# Patient Record
Sex: Female | Born: 1935 | Race: White | Hispanic: No
Health system: Southern US, Community
[De-identification: ages and names within clinical notes are randomized; demographics above are authoritative.]

---

## 2015-04-04 ENCOUNTER — Inpatient Hospital Stay
Admission: AD | Admit: 2015-04-04 | Discharge: 2015-04-15 | Disposition: A | Discharge status: Skilled Nursing Facility | Payer: Medicare Other | Source: Ambulatory Visit | Attending: Internal Medicine | Admitting: Internal Medicine

## 2015-04-04 ENCOUNTER — Other Ambulatory Visit (HOSPITAL_COMMUNITY): Payer: Self-pay

## 2015-04-04 DIAGNOSIS — J189 Pneumonia, unspecified organism: Secondary | ICD-10-CM

## 2015-04-04 DIAGNOSIS — J9690 Respiratory failure, unspecified, unspecified whether with hypoxia or hypercapnia: Secondary | ICD-10-CM

## 2015-04-04 DIAGNOSIS — L602 Onychogryphosis: Secondary | ICD-10-CM

## 2015-04-04 LAB — BLOOD GAS, ARTERIAL
Acid-Base Excess: 5.8 mmol/L — ABNORMAL HIGH (ref 0.0–2.0)
BICARBONATE: 29 meq/L — AB (ref 20.0–24.0)
FIO2: 0.3
LHR: 14 {breaths}/min
O2 Saturation: 98.5 %
PATIENT TEMPERATURE: 98.6
PCO2 ART: 36.3 mmHg (ref 35.0–45.0)
PEEP: 5 cmH2O
TCO2: 30.1 mmol/L (ref 0–100)
VT: 500 mL
pH, Arterial: 7.513 — ABNORMAL HIGH (ref 7.350–7.450)
pO2, Arterial: 112 mmHg — ABNORMAL HIGH (ref 80.0–100.0)

## 2015-04-05 ENCOUNTER — Other Ambulatory Visit (HOSPITAL_COMMUNITY): Payer: Self-pay

## 2015-04-05 LAB — CBC
HEMATOCRIT: 27.1 % — AB (ref 36.0–46.0)
HEMOGLOBIN: 8.6 g/dL — AB (ref 12.0–15.0)
MCH: 30 pg (ref 26.0–34.0)
MCHC: 31.7 g/dL (ref 30.0–36.0)
MCV: 94.4 fL (ref 78.0–100.0)
Platelets: 319 10*3/uL (ref 150–400)
RBC: 2.87 MIL/uL — AB (ref 3.87–5.11)
RDW: 13.6 % (ref 11.5–15.5)
WBC: 10.2 10*3/uL (ref 4.0–10.5)

## 2015-04-05 LAB — PROTIME-INR
INR: 1.2 (ref 0.00–1.49)
PROTHROMBIN TIME: 15.4 s — AB (ref 11.6–15.2)

## 2015-04-05 LAB — VANCOMYCIN, TROUGH: VANCOMYCIN TR: 17 ug/mL (ref 10.0–20.0)

## 2015-04-05 LAB — BASIC METABOLIC PANEL
Anion gap: 8 (ref 5–15)
BUN: 18 mg/dL (ref 6–20)
CHLORIDE: 112 mmol/L — AB (ref 101–111)
CO2: 30 mmol/L (ref 22–32)
Calcium: 7.6 mg/dL — ABNORMAL LOW (ref 8.9–10.3)
Creatinine, Ser: 0.85 mg/dL (ref 0.44–1.00)
GFR calc non Af Amer: 56 mL/min — ABNORMAL LOW (ref >60)
Glucose, Bld: 158 mg/dL — ABNORMAL HIGH (ref 65–99)
POTASSIUM: 2.7 mmol/L — AB (ref 3.5–5.1)
SODIUM: 150 mmol/L — AB (ref 135–145)

## 2015-04-05 LAB — C-REACTIVE PROTEIN: CRP: 9.8 mg/dL — AB (ref <1.0)

## 2015-04-05 LAB — MAGNESIUM: MAGNESIUM: 2.1 mg/dL (ref 1.7–2.4)

## 2015-04-06 LAB — BASIC METABOLIC PANEL
Anion gap: 8 (ref 5–15)
BUN: 16 mg/dL (ref 6–20)
CHLORIDE: 112 mmol/L — AB (ref 101–111)
CO2: 29 mmol/L (ref 22–32)
CREATININE: 0.67 mg/dL (ref 0.44–1.00)
Calcium: 7.8 mg/dL — ABNORMAL LOW (ref 8.9–10.3)
GFR calc Af Amer: >60 mL/min (ref >60)
GFR calc non Af Amer: >60 mL/min (ref >60)
GLUCOSE: 172 mg/dL — AB (ref 65–99)
Potassium: 3.3 mmol/L — ABNORMAL LOW (ref 3.5–5.1)
Sodium: 149 mmol/L — ABNORMAL HIGH (ref 135–145)

## 2015-04-07 LAB — BASIC METABOLIC PANEL
Anion gap: 6 (ref 5–15)
BUN: 15 mg/dL (ref 6–20)
CO2: 29 mmol/L (ref 22–32)
CREATININE: 0.74 mg/dL (ref 0.44–1.00)
Calcium: 8 mg/dL — ABNORMAL LOW (ref 8.9–10.3)
Chloride: 113 mmol/L — ABNORMAL HIGH (ref 101–111)
GFR calc non Af Amer: >60 mL/min (ref >60)
Glucose, Bld: 118 mg/dL — ABNORMAL HIGH (ref 65–99)
Potassium: 3.2 mmol/L — ABNORMAL LOW (ref 3.5–5.1)
SODIUM: 148 mmol/L — AB (ref 135–145)

## 2015-04-08 DIAGNOSIS — J189 Pneumonia, unspecified organism: Secondary | ICD-10-CM

## 2015-04-08 DIAGNOSIS — J9601 Acute respiratory failure with hypoxia: Secondary | ICD-10-CM | POA: Diagnosis not present

## 2015-04-08 LAB — BASIC METABOLIC PANEL
Anion gap: 8 (ref 5–15)
BUN: 16 mg/dL (ref 6–20)
CHLORIDE: 110 mmol/L (ref 101–111)
CO2: 30 mmol/L (ref 22–32)
CREATININE: 0.76 mg/dL (ref 0.44–1.00)
Calcium: 8 mg/dL — ABNORMAL LOW (ref 8.9–10.3)
GFR calc Af Amer: >60 mL/min (ref >60)
GFR calc non Af Amer: >60 mL/min (ref >60)
GLUCOSE: 148 mg/dL — AB (ref 65–99)
POTASSIUM: 3.2 mmol/L — AB (ref 3.5–5.1)
Sodium: 148 mmol/L — ABNORMAL HIGH (ref 135–145)

## 2015-04-08 LAB — VANCOMYCIN, TROUGH: Vancomycin Tr: 17 ug/mL (ref 10.0–20.0)

## 2015-04-08 NOTE — Consult Note (Signed)
Name: Anne Dawson MRN: [id number] DOB: [birth year]    ADMISSION DATE:  04/04/2015 CONSULTATION DATE:  1/16  REFERRING MD :  Hijazi  CHIEF COMPLAINT:  Ventilator weaning  BRIEF PATIENT DESCRIPTION:   SIGNIFICANT EVENTS    STUDIES:     HISTORY OF PRESENT ILLNESS:   This is a 82 year old white female who resides at a skilled nursing facility. She has a past medical history of deconditioning, reflux esophagitis, congestive heart failure and recent admission for right lower extremity cellulitis, and healthcare associated pneumonia. [facility] for ventilator weaning and continued pulmonary hygiene as well as rehabilitative efforts.  PAST MEDICAL HISTORY :  Congestive heart failure (diastolic), hypertension, depression, anxiety, chronic abdominal pain, history of renal cell carcinoma with prior nephrectomy, neuropathy, history of gout, history of reflux esophagitis, chronic pedal edema, restless leg syndrome, SIADH, and recent right lower extremity cellulitis.  Prior to Admission medications   Aspirin, atorvastatin, Sinemet, Cymbalta, DuoNeb, but no patch, Prilosec, vancomycin, Zosyn, cyclosporine eye drops.   Allergies: Codeine, iodine, Neosporin, neomycin sulfate, polymyxin.  FAMILY HISTORY:   SOCIAL HISTORY: Resides at a skilled nursing facility, has a history of smoking. At baseline she spends most of day in chair or bed Can walk w/ walker Son & daughter in-law tell me she has been declining over last 12 mo   REVIEW OF SYSTEMS:   Unable   SUBJECTIVE:  Anxious wants ETT out  VITAL SIGNS: Reviewed   PHYSICAL EXAMINATION: General:  Chronically ill appearing white female, wants ETT out  Neuro:  Awake, HOH, orally intubated  HEENT:  NCAT, OETT and OGT in place  Cardiovascular:  rrr no MRG Lungs:  Scattered rhonchi, no accessory muscle use  Abdomen:  Soft, not tender. Tol TFs Musculoskeletal:  Equal st and bulk  Skin:  Warm  and dry    Recent Labs Lab 04/06/15 0646 04/07/15 0515 04/08/15 0448  NA 149* 148* 148*  K 3.3* 3.2* 3.2*  CL 112* 113* 110  CO2 29 29 30   BUN 16 15 16   CREATININE 0.67 0.74 0.76  GLUCOSE 172* 118* 148*    Recent Labs Lab 04/05/15 0705  HGB 8.6*  HCT 27.1*  WBC 10.2  PLT 319   ABG    Component Value Date/Time   PHART 7.513* 04/04/2015 1719   PCO2ART 36.3 04/04/2015 1719   PO2ART 112* 04/04/2015 1719   HCO3 29.0* 04/04/2015 1719   TCO2 30.1 04/04/2015 1719   O2SAT 98.5 04/04/2015 1719    No results found. Chest x-ray:ET tube in good position, left internal jugular vein central venous catheter terminates in the SVC, she has right greater than left patchy bilateral infiltrates  ASSESSMENT / PLAN:  Acute hypoxic respiratory failure in the setting of healthcare associated pneumonia  Ventilator dependence Results severe sepsis Recent cellulitis Physical deconditioning History of diastolic heart dysfunction Hypernatremia  Her O2 requirements are minimal and her F/Vt is boarderline, but acceptable. Have had a long discussion w/ her son at bedside re: trach and prolonged care. They have noted a decline in her overall fxn in the last 12 months and are deeply concerned about her quality of life. From a pneumonia and an acute illness stand-point she has had appropriate treatment and there is little else to offer at this point. We discussed further the idea of one-way-extubation. The family is in full agreement to NOT re-intubate and are comfortable w/ the concept of extubation and supportive care w/ a "hope-for-the-best" approach;  but they are also prepared that we could be dealing w/ transition to comfort/palliation IF she decompensates.  Plan Extubate to [address] on 1/17 Cont abx per primary team No Code DNR NO BIPAP Wean FIO2 Cont pulm hygiene measures Would have SLP see her. Would rec diet modifications over tube feeds given focus of care  Simonne Martinet  ACNP-BC [facility] Pulmonary/Critical Care Pager # [phone] OR # [phone] if no answer   04/08/2015, 3:04 PM  Attending Note:  82 year old female with VDRF due to HCAP and deconditioning.  On exam, coarse BS diffusely.  I reviewed CXR myself, ETT in appropriate position and infiltrate noted.  Patient has a consult out for ENT to trach the patient.  We had an extensive discussion with the family.  They have noticed that she has been declining over the last year.  They are more concerned with suffering and the quality of life at this point and I can not agree more with them.  Tracheostomy and PEG in this case would be futile care for this patient.  Therefore, given the fact that patient has received appropriate course of treatment for her HCAP and her current weaning level, we have come to the agreement that extubation in AM with DNR status afterwards would be the appropriate course of action.  Will extubate in AM and make DNR.  If deteriorates then transition to comfort care.  The patient is critically ill with multiple organ systems failure and requires high complexity decision making for assessment and support, frequent evaluation and titration of therapies, application of advanced monitoring technologies and extensive interpretation of multiple databases.   Critical Care Time devoted to patient care services described in this note is  35  Minutes. This time reflects time of care of this signee Dr Koren Bound. This critical care time does not reflect procedure time, or teaching time or supervisory time of PA/NP/Med student/Med Resident etc but could involve care discussion time.  Alyson Reedy, M.D. [facility] Pulmonary/Critical Care Medicine. Pager: [phone]. After hours pager: [phone].

## 2015-04-09 DIAGNOSIS — F0391 Unspecified dementia with behavioral disturbance: Secondary | ICD-10-CM | POA: Diagnosis not present

## 2015-04-09 DIAGNOSIS — Z515 Encounter for palliative care: Secondary | ICD-10-CM

## 2015-04-09 DIAGNOSIS — J9601 Acute respiratory failure with hypoxia: Secondary | ICD-10-CM | POA: Diagnosis not present

## 2015-04-09 NOTE — Progress Notes (Signed)
   Name: Candela Krul MRN: [id number] DOB: Aug 22, 1916    ADMISSION DATE:  04/04/2015 CONSULTATION DATE:  1/16  REFERRING MD :  Hijazi  CHIEF COMPLAINT:  Ventilator weaning  BRIEF PATIENT DESCRIPTION:   SIGNIFICANT EVENTS    STUDIES:    SUBJECTIVE:  Calm and cooperative. ETT out  VITAL SIGNS: Reviewed    PHYSICAL EXAMINATION: General:  Chronically ill appearing white female, wants ETT out no distress Neuro:  Awake, HOH, orally intubated  HEENT:  NCAT, OETT and OGT in place  Cardiovascular:  rrr no MRG Lungs:  Scattered rhonchi, no accessory muscle use  Abdomen:  Soft, not tender. Tol TFs Musculoskeletal:  Equal st and bulk  Skin:  Warm and dry    Recent Labs Lab 04/06/15 0646 04/07/15 0515 04/08/15 0448  NA 149* 148* 148*  K 3.3* 3.2* 3.2*  CL 112* 113* 110  CO2 '29 29 30  '$ BUN '16 15 16  '$ CREATININE 0.67 0.74 0.76  GLUCOSE 172* 118* 148*    Recent Labs Lab 04/05/15 0705  HGB 8.6*  HCT 27.1*  WBC 10.2  PLT 319   ABG    Component Value Date/Time   PHART 7.513* 04/04/2015 1719   PCO2ART 36.3 04/04/2015 1719   PO2ART 112* 04/04/2015 1719   HCO3 29.0* 04/04/2015 1719   TCO2 30.1 04/04/2015 1719   O2SAT 98.5 04/04/2015 1719    No results found. Chest x-ray:ET tube in good position, left internal jugular vein central venous catheter terminates in the SVC, she has right greater than left patchy bilateral infiltrates  ASSESSMENT / PLAN:  Acute hypoxic respiratory failure in the setting of healthcare associated pneumonia  Ventilator dependence Results severe sepsis Recent cellulitis Physical deconditioning History of diastolic heart dysfunction Hypernatremia  Her O2 requirements are minimal and her F/Vt is acceptable @ <105, she is awake, follows commands and in no distress. Have reviewed plan w/ her family. She looks ready for extubation. Again she is DO NOT re-intubate and we will proceed w/ a "hope-for-the-best" approach. Family is prepared that we  could be dealing w/ transition to comfort/palliation IF she decompensates.  Plan Extubate to [place] Cont abx per primary team No Code DNR NO BIPAP Wean FIO2 Cont pulm hygiene measures Would have SLP see her. Would rec diet modifications over tube feeds given focus of care  Erick Colace ACNP-BC [facility] Pager # [phone] OR # [phone] if no answer  Attending Note:  82 year old female with extensive PMH presenting intubated to [facility].  Patient improved and PCCM was consulted to assist with goals of care discussion and vent management.  Met with son yesterday, and decision was made to extubate today and make a full DNR.  Tracheostomy here would be very inappropriate.  Discussed with SSH-MD and PCCM-NP.  I reviewed CXR myself, pulmonary edema and bibasilar opacification.  Extubate to [place] and titrate O2 for sat of 88-92%. Full DNR. No BiPAP. Swallow evaluation. Monitor for airway protection. If deteriorates then comfort care.  PCCM will sign off, please call back if needed.  Patient seen and examined, agree with above note.  I dictated the care and orders written for this patient under my direction.  Rush Farmer, MD [phone]

## 2015-04-10 LAB — BASIC METABOLIC PANEL
ANION GAP: 6 (ref 5–15)
BUN: 15 mg/dL (ref 6–20)
CALCIUM: 8.2 mg/dL — AB (ref 8.9–10.3)
CO2: 33 mmol/L — ABNORMAL HIGH (ref 22–32)
Chloride: 108 mmol/L (ref 101–111)
Creatinine, Ser: 0.69 mg/dL (ref 0.44–1.00)
Glucose, Bld: 107 mg/dL — ABNORMAL HIGH (ref 65–99)
Potassium: 3.6 mmol/L (ref 3.5–5.1)
SODIUM: 147 mmol/L — AB (ref 135–145)

## 2015-04-10 LAB — CBC
HCT: 30.8 % — ABNORMAL LOW (ref 36.0–46.0)
Hemoglobin: 9.5 g/dL — ABNORMAL LOW (ref 12.0–15.0)
MCH: 29.7 pg (ref 26.0–34.0)
MCHC: 30.8 g/dL (ref 30.0–36.0)
MCV: 96.3 fL (ref 78.0–100.0)
PLATELETS: 522 10*3/uL — AB (ref 150–400)
RBC: 3.2 MIL/uL — ABNORMAL LOW (ref 3.87–5.11)
RDW: 13.5 % (ref 11.5–15.5)
WBC: 9.2 10*3/uL (ref 4.0–10.5)

## 2015-04-12 ENCOUNTER — Other Ambulatory Visit (HOSPITAL_COMMUNITY): Payer: Self-pay

## 2015-04-12 LAB — CBC WITH DIFFERENTIAL/PLATELET
BASOS ABS: 0 10*3/uL (ref 0.0–0.1)
BASOS PCT: 1 %
EOS ABS: 0.1 10*3/uL (ref 0.0–0.7)
Eosinophils Relative: 2 %
HCT: 30.6 % — ABNORMAL LOW (ref 36.0–46.0)
HEMOGLOBIN: 9.7 g/dL — AB (ref 12.0–15.0)
LYMPHS ABS: 1.6 10*3/uL (ref 0.7–4.0)
Lymphocytes Relative: 21 %
MCH: 29.8 pg (ref 26.0–34.0)
MCHC: 31.7 g/dL (ref 30.0–36.0)
MCV: 93.9 fL (ref 78.0–100.0)
Monocytes Absolute: 0.6 10*3/uL (ref 0.1–1.0)
Monocytes Relative: 8 %
NEUTROS PCT: 70 %
Neutro Abs: 5.4 10*3/uL (ref 1.7–7.7)
Platelets: 590 10*3/uL — ABNORMAL HIGH (ref 150–400)
RBC: 3.26 MIL/uL — AB (ref 3.87–5.11)
RDW: 13.4 % (ref 11.5–15.5)
WBC: 7.7 10*3/uL (ref 4.0–10.5)

## 2015-04-12 LAB — BASIC METABOLIC PANEL
ANION GAP: 8 (ref 5–15)
BUN: 11 mg/dL (ref 6–20)
CHLORIDE: 102 mmol/L (ref 101–111)
CO2: 34 mmol/L — AB (ref 22–32)
Calcium: 8.4 mg/dL — ABNORMAL LOW (ref 8.9–10.3)
Creatinine, Ser: 0.71 mg/dL (ref 0.44–1.00)
GLUCOSE: 105 mg/dL — AB (ref 65–99)
POTASSIUM: 3.2 mmol/L — AB (ref 3.5–5.1)
SODIUM: 144 mmol/L (ref 135–145)

## 2015-04-13 LAB — POTASSIUM: Potassium: 3.2 mmol/L — ABNORMAL LOW (ref 3.5–5.1)

## 2015-04-14 LAB — BASIC METABOLIC PANEL
ANION GAP: 10 (ref 5–15)
BUN: 11 mg/dL (ref 6–20)
CHLORIDE: 101 mmol/L (ref 101–111)
CO2: 32 mmol/L (ref 22–32)
Calcium: 8.5 mg/dL — ABNORMAL LOW (ref 8.9–10.3)
Creatinine, Ser: 0.66 mg/dL (ref 0.44–1.00)
GFR calc Af Amer: >60 mL/min (ref >60)
GFR calc non Af Amer: >60 mL/min (ref >60)
Glucose, Bld: 125 mg/dL — ABNORMAL HIGH (ref 65–99)
POTASSIUM: 3.9 mmol/L (ref 3.5–5.1)
Sodium: 143 mmol/L (ref 135–145)

## 2015-04-14 LAB — CBC WITH DIFFERENTIAL/PLATELET
Basophils Absolute: 0 10*3/uL (ref 0.0–0.1)
Basophils Relative: 0 %
Eosinophils Absolute: 0.1 10*3/uL (ref 0.0–0.7)
Eosinophils Relative: 2 %
HEMATOCRIT: 31.5 % — AB (ref 36.0–46.0)
HEMOGLOBIN: 10 g/dL — AB (ref 12.0–15.0)
LYMPHS PCT: 21 %
Lymphs Abs: 1.6 10*3/uL (ref 0.7–4.0)
MCH: 29.6 pg (ref 26.0–34.0)
MCHC: 31.7 g/dL (ref 30.0–36.0)
MCV: 93.2 fL (ref 78.0–100.0)
MONO ABS: 0.5 10*3/uL (ref 0.1–1.0)
MONOS PCT: 7 %
NEUTROS ABS: 5.1 10*3/uL (ref 1.7–7.7)
Neutrophils Relative %: 70 %
Platelets: 622 10*3/uL — ABNORMAL HIGH (ref 150–400)
RBC: 3.38 MIL/uL — ABNORMAL LOW (ref 3.87–5.11)
RDW: 13.8 % (ref 11.5–15.5)
WBC: 7.3 10*3/uL (ref 4.0–10.5)

## 2015-04-14 LAB — MAGNESIUM: MAGNESIUM: 2.1 mg/dL (ref 1.7–2.4)

## 2015-04-14 LAB — PHOSPHORUS: PHOSPHORUS: 3.2 mg/dL (ref 2.5–4.6)

## 2017-03-14 IMAGING — CR DG ABD PORTABLE 1V
1 series · 1 of 1 positions shown · non-contrast
Comparison: None.

CLINICAL DATA: Status post placement of orogastric tube.

EXAM:
PORTABLE ABDOMEN - 1 VIEW

[AP]
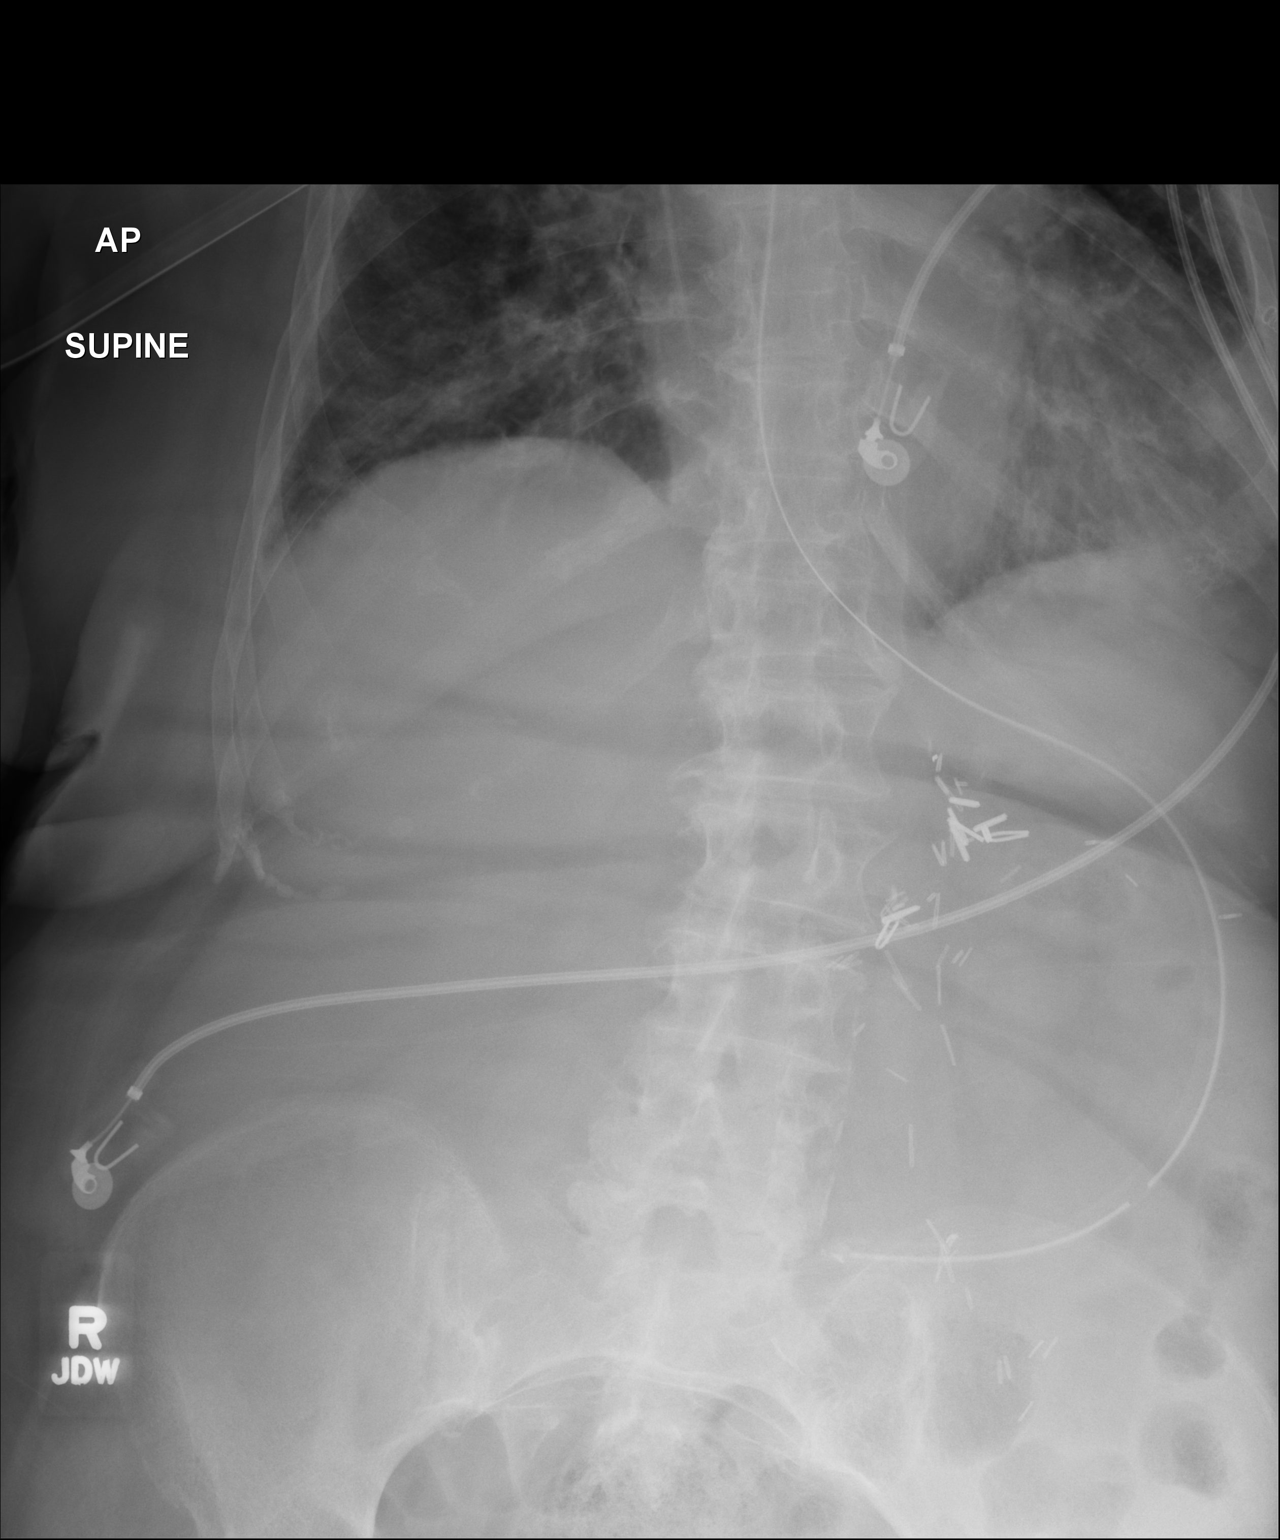

[1 of 1 positions shown; findings below may reference images not displayed]

FINDINGS: OG tube is in good position with the side-port and tip in the body
of the stomach.
IMPRESSION: As above.

## 2017-03-15 IMAGING — DX DG CHEST 1V PORT
1 series · 1 of 1 positions shown · non-contrast
Comparison: 04/04/2015

CLINICAL DATA: Respiratory failure

EXAM:
PORTABLE CHEST 1 VIEW

[chest ap]
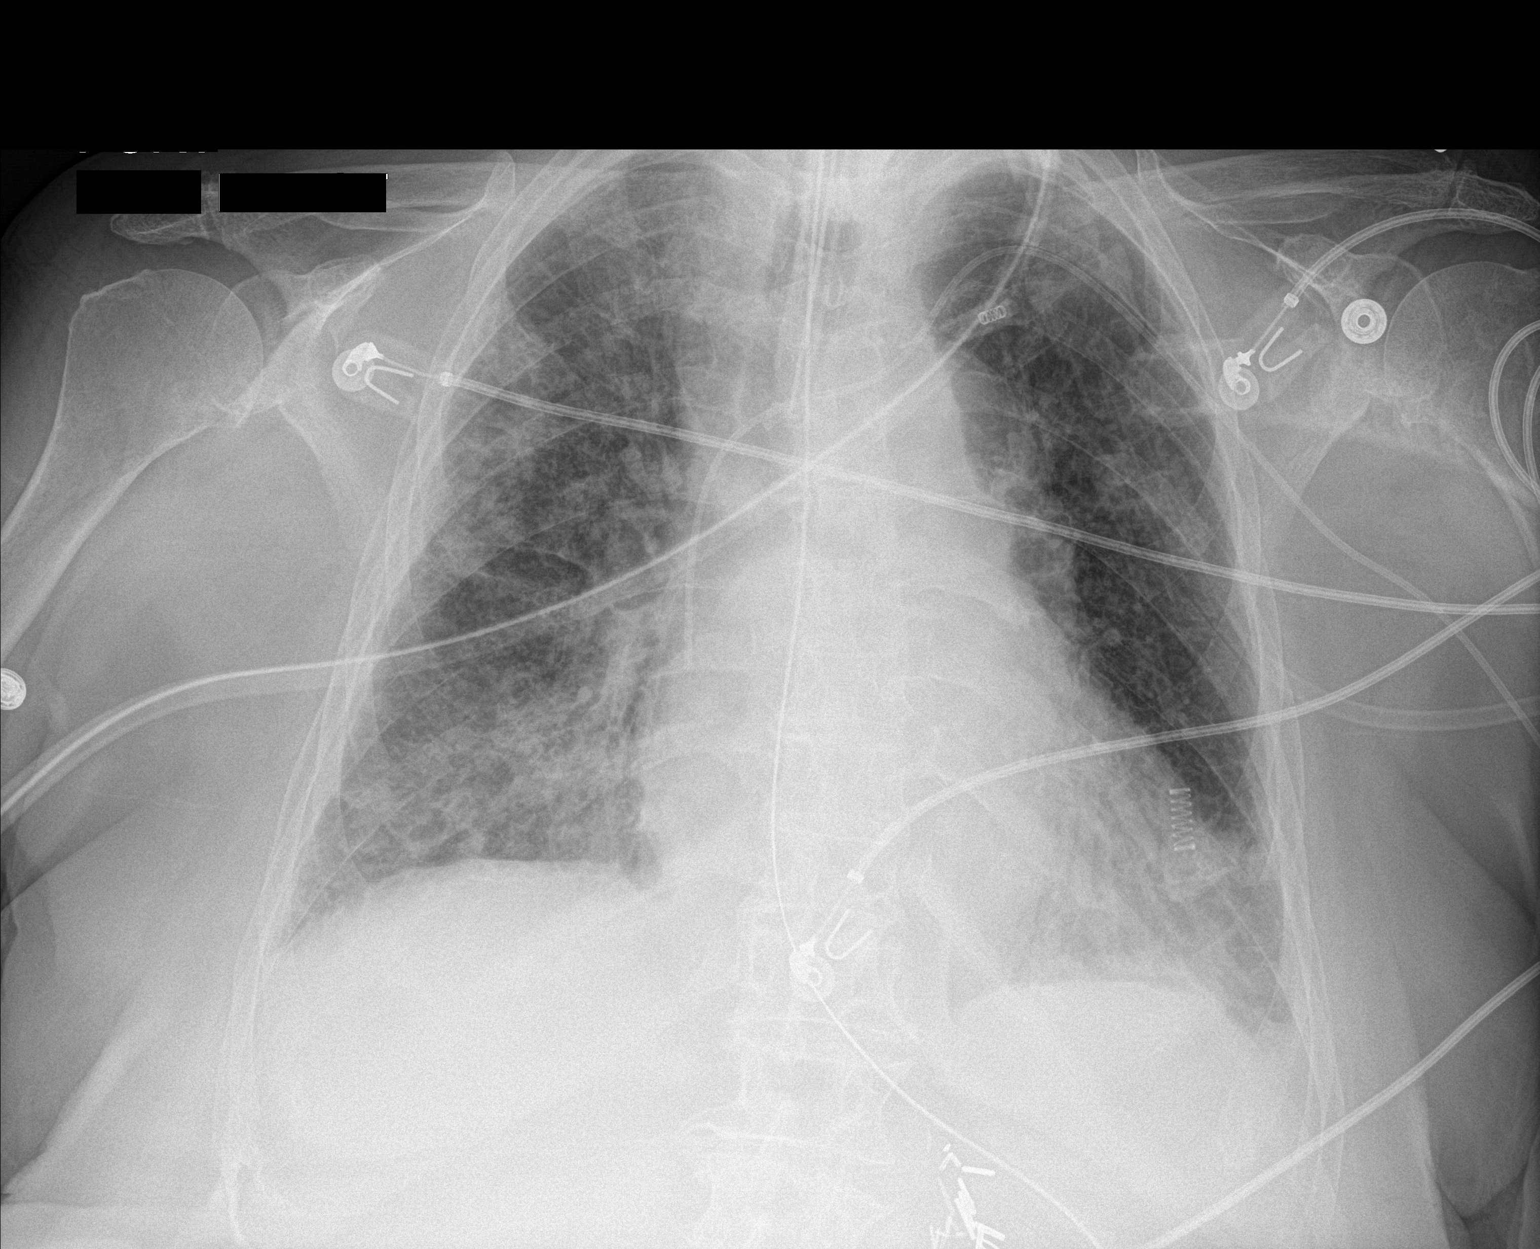

[1 of 1 positions shown; findings below may reference images not displayed]

FINDINGS: Endotracheal tube is in place with tip 3.0 cm above carina.
Left-sided PICC line tip overlies the level of superior vena cava.
Nasogastric tube is in place, tip off the film beyond the
gastroesophageal junction.

The heart is enlarged. There are patchy infiltrates within the lungs
bilaterally.
IMPRESSION: 1. Cardiomegaly.
2. Bilateral infiltrates. Considerations include multifocal
pneumonia or edema.

## 2019-01-22 DEATH — deceased
